# Patient Record
Sex: Male | Born: 1990 | Race: White | Hispanic: No | Marital: Single | Smoking: Never smoker
Health system: Southern US, Community
[De-identification: ages and names within clinical notes are randomized; demographics above are authoritative.]

---
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
## 2017-01-22 HISTORY — PX: KNEE SURGERY: SHX244

## 2017-03-11 DIAGNOSIS — M9901 Segmental and somatic dysfunction of cervical region: Secondary | ICD-10-CM | POA: Diagnosis not present

## 2017-03-11 DIAGNOSIS — M62830 Muscle spasm of back: Secondary | ICD-10-CM | POA: Diagnosis not present

## 2017-03-11 DIAGNOSIS — M9903 Segmental and somatic dysfunction of lumbar region: Secondary | ICD-10-CM | POA: Diagnosis not present

## 2017-03-13 DIAGNOSIS — M9901 Segmental and somatic dysfunction of cervical region: Secondary | ICD-10-CM | POA: Diagnosis not present

## 2017-03-13 DIAGNOSIS — M9903 Segmental and somatic dysfunction of lumbar region: Secondary | ICD-10-CM | POA: Diagnosis not present

## 2017-03-13 DIAGNOSIS — M62830 Muscle spasm of back: Secondary | ICD-10-CM | POA: Diagnosis not present

## 2017-03-14 DIAGNOSIS — M9901 Segmental and somatic dysfunction of cervical region: Secondary | ICD-10-CM | POA: Diagnosis not present

## 2017-03-14 DIAGNOSIS — M62830 Muscle spasm of back: Secondary | ICD-10-CM | POA: Diagnosis not present

## 2017-03-14 DIAGNOSIS — M9903 Segmental and somatic dysfunction of lumbar region: Secondary | ICD-10-CM | POA: Diagnosis not present

## 2017-03-18 DIAGNOSIS — M9903 Segmental and somatic dysfunction of lumbar region: Secondary | ICD-10-CM | POA: Diagnosis not present

## 2017-03-18 DIAGNOSIS — M62830 Muscle spasm of back: Secondary | ICD-10-CM | POA: Diagnosis not present

## 2017-03-18 DIAGNOSIS — M9901 Segmental and somatic dysfunction of cervical region: Secondary | ICD-10-CM | POA: Diagnosis not present

## 2017-03-20 DIAGNOSIS — M9901 Segmental and somatic dysfunction of cervical region: Secondary | ICD-10-CM | POA: Diagnosis not present

## 2017-03-20 DIAGNOSIS — M9903 Segmental and somatic dysfunction of lumbar region: Secondary | ICD-10-CM | POA: Diagnosis not present

## 2017-03-20 DIAGNOSIS — M62830 Muscle spasm of back: Secondary | ICD-10-CM | POA: Diagnosis not present

## 2017-03-21 DIAGNOSIS — M9903 Segmental and somatic dysfunction of lumbar region: Secondary | ICD-10-CM | POA: Diagnosis not present

## 2017-03-21 DIAGNOSIS — M9901 Segmental and somatic dysfunction of cervical region: Secondary | ICD-10-CM | POA: Diagnosis not present

## 2017-03-21 DIAGNOSIS — M62830 Muscle spasm of back: Secondary | ICD-10-CM | POA: Diagnosis not present

## 2017-04-10 DIAGNOSIS — M9903 Segmental and somatic dysfunction of lumbar region: Secondary | ICD-10-CM | POA: Diagnosis not present

## 2017-04-10 DIAGNOSIS — M62830 Muscle spasm of back: Secondary | ICD-10-CM | POA: Diagnosis not present

## 2017-04-10 DIAGNOSIS — M9901 Segmental and somatic dysfunction of cervical region: Secondary | ICD-10-CM | POA: Diagnosis not present

## 2017-04-25 DIAGNOSIS — R509 Fever, unspecified: Secondary | ICD-10-CM | POA: Diagnosis not present

## 2017-09-20 DIAGNOSIS — D2261 Melanocytic nevi of right upper limb, including shoulder: Secondary | ICD-10-CM | POA: Diagnosis not present

## 2017-09-20 DIAGNOSIS — D2262 Melanocytic nevi of left upper limb, including shoulder: Secondary | ICD-10-CM | POA: Diagnosis not present

## 2017-09-20 DIAGNOSIS — B360 Pityriasis versicolor: Secondary | ICD-10-CM | POA: Diagnosis not present

## 2017-09-21 ENCOUNTER — Emergency Department: Payer: Commercial Managed Care - HMO

## 2017-09-21 ENCOUNTER — Other Ambulatory Visit: Payer: Self-pay

## 2017-09-21 ENCOUNTER — Emergency Department
Admission: EM | Admit: 2017-09-21 | Discharge: 2017-09-21 | Disposition: A | Discharge status: Home / Self Care | Payer: Commercial Managed Care - HMO | Attending: Emergency Medicine | Admitting: Emergency Medicine

## 2017-09-21 ENCOUNTER — Encounter: Payer: Self-pay | Admitting: Emergency Medicine

## 2017-09-21 DIAGNOSIS — M62830 Muscle spasm of back: Secondary | ICD-10-CM | POA: Insufficient documentation

## 2017-09-21 DIAGNOSIS — M546 Pain in thoracic spine: Secondary | ICD-10-CM | POA: Diagnosis not present

## 2017-09-21 DIAGNOSIS — M549 Dorsalgia, unspecified: Secondary | ICD-10-CM | POA: Diagnosis not present

## 2017-09-21 MED ORDER — METHOCARBAMOL 500 MG PO TABS: ORAL_TABLET | ORAL | 0 refills | Status: DC

## 2017-09-21 MED ORDER — METHOCARBAMOL 500 MG PO TABS
1000.0000 mg | ORAL_TABLET | Freq: Once | ORAL | Status: AC
Start: 2017-09-21 — End: 2017-09-21
  Administered 2017-09-21: 1000 mg via ORAL
  Filled 2017-09-21: qty 2

## 2017-09-21 MED ORDER — HYDROCODONE-ACETAMINOPHEN 5-325 MG PO TABS: 1.0000 | ORAL_TABLET | Freq: Four times a day (QID) | ORAL | 0 refills | Status: DC | PRN

## 2017-09-21 MED ORDER — IBUPROFEN 600 MG PO TABS
600.0000 mg | ORAL_TABLET | Freq: Once | ORAL | Status: AC
  Administered 2017-09-21: 600 mg via ORAL

## 2017-09-21 MED ORDER — IBUPROFEN 600 MG PO TABS: 600.0000 mg | ORAL_TABLET | Freq: Three times a day (TID) | ORAL | 0 refills | Status: DC | PRN

## 2017-09-21 MED ORDER — HYDROCODONE-ACETAMINOPHEN 5-325 MG PO TABS
1.0000 | ORAL_TABLET | Freq: Once | ORAL | Status: DC
  Filled 2017-09-21: qty 1

## 2017-09-21 MED ORDER — IBUPROFEN 600 MG PO TABS
ORAL_TABLET | ORAL | Status: AC
  Filled 2017-09-21: qty 1

## 2017-09-21 NOTE — ED Provider Notes (Signed)
[facility] Emergency Department Provider Note  ____________________________________________   First MD Initiated Contact with Patient 09/21/17 1233     (approximate)  I have reviewed the triage vital signs and the nursing notes.   HISTORY  Chief Complaint Back Pain   HPI Samuel Hart is a 27 y.o. male resents to the emergency department after he felt muscle spasms in his back when he stepped out of a truck 30 minutes prior to his arrival.  He denies any actual injury to his back or fall like injury.  He continues to have some muscle spasms and is pain is increased with deep inspiration or movement.  Pain is located right posterior back just below the scapula.  Pain is rated as 9/10.   History reviewed. No pertinent past medical history.  There are no active problems to display for this patient.   History reviewed. No pertinent surgical history.  Prior to Admission medications   Medication Sig Start Date End Date Taking? Authorizing Provider  HYDROcodone-acetaminophen (NORCO/VICODIN) 5-325 MG tablet Take 1 tablet by mouth every 6 (six) hours as needed for moderate pain. 09/21/17   Johnn Hai, PA-C  ibuprofen (ADVIL,MOTRIN) 600 MG tablet Take 1 tablet (600 mg total) by mouth every 8 (eight) hours as needed for mild pain. 09/21/17   Johnn Hai, PA-C  methocarbamol (ROBAXIN) 500 MG tablet 1-2 every 6 hours prn muscle spasms 09/21/17   Johnn Hai, PA-C    Allergies Patient has no known allergies.  No family history on file.  Social History Social History   Tobacco Use  . Smoking status: Never Smoker  Substance Use Topics  . Alcohol use: Not on file  . Drug use: Not on file    Review of Systems Constitutional: No fever/chills Eyes: No visual changes. Cardiovascular: Denies chest pain. Respiratory: Denies shortness of breath. Gastrointestinal: No abdominal pain.  No nausea, no vomiting.   Musculoskeletal: Positive for right  upper back pain. Skin: Negative for rash. Neurological: Negative for headaches, focal weakness or numbness. ____________________________________________   PHYSICAL EXAM:  VITAL SIGNS: ED Triage Vitals  Enc Vitals Group     BP 09/21/17 1213 137/81     Pulse Rate 09/21/17 1213 (!) 55     Resp 09/21/17 1213 18     Temp 09/21/17 1213 97.7 F (36.5 C)     Temp Source 09/21/17 1213 Oral     SpO2 09/21/17 1213 100 %     Weight 09/21/17 1216 240 lb (108.9 kg)     Height 09/21/17 1216 6' (1.829 m)     Head Circumference --      Peak Flow --      Pain Score 09/21/17 1215 9     Pain Loc --      Pain Edu? --      Excl. in [place]? --    Constitutional: Alert and oriented. Well appearing and in no acute distress. Eyes: Conjunctivae are normal.  Head: Atraumatic. Neck: No stridor.   Cardiovascular: Normal rate, regular rhythm. Grossly normal heart sounds.  Good peripheral circulation. Respiratory: Normal respiratory effort.  No retractions. Lungs CTAB. Musculoskeletal: On examination of the back there is no gross deformity however there is moderate tenderness on palpation of the parascapular muscles and range of motion is restricted secondary to discomfort.  Patient also with these but inspiration has discomfort in this area.  Area with moderate localized muscle spasms.  No skin discoloration is noted.  On examination of  the shoulder itself there is no gross deformity and no evidence of injury. Neurologic:  Normal speech and language. No gross focal neurologic deficits are appreciated. No gait instability. Skin:  Skin is warm, dry and intact.  No erythema, ecchymosis, or abrasions were seen. Psychiatric: Mood and affect are normal. Speech and behavior are normal.  ____________________________________________   LABS (all labs ordered are listed, but only abnormal results are displayed)  Labs Reviewed - No data to display  RADIOLOGY  ED MD interpretation:   Chest x-ray is  negative.  Official radiology report(s): Dg Chest 2 View  Result Date: 09/21/2017 CLINICAL DATA:  27 year old male with recent onset of back pain/spasm while stepping out of a truck. EXAM: CHEST - 2 VIEW COMPARISON:  None. FINDINGS: The lungs are clear and negative for focal airspace consolidation, pulmonary edema or suspicious pulmonary nodule. No pleural effusion or pneumothorax. Cardiac and mediastinal contours are within normal limits. No acute fracture or lytic or blastic osseous lesions. The visualized upper abdominal bowel gas pattern is unremarkable. IMPRESSION: Normal chest x-ray. Electronically Signed   By: Jacqulynn Cadet M.D.   On: 09/21/2017 12:40    ____________________________________________   PROCEDURES  Procedure(s) performed: None  Procedures  Critical Care performed: No  ____________________________________________   INITIAL IMPRESSION / ASSESSMENT AND PLAN / ED COURSE  As part of my medical decision making, I reviewed the following data within the electronic MEDICAL RECORD NUMBER Notes from prior ED visits and [place] Controlled Substance Database  Patient presents to the emergency department with sudden onset of muscle spasms after getting out of his truck today.  Patient was given methocarbamol 1000 mg p.o. along with ibuprofen.  X-rays were reassuring and patient was made aware that muscle spasms is most likely since he did not actually have an injury.  Patient was given prescription for Robaxin 1 or 2 every 6 hours, ibuprofen 600 mg every 8 hours and Norco every 6 hours if needed for moderate pain.  Patient is encouraged to use moist heat to the area frequently.  We also discussed using a TENS unit starting tomorrow to alleviate some of his discomfort.  Patient is aware that the muscle relaxant could cause drowsiness.  ____________________________________________   FINAL CLINICAL IMPRESSION(S) / ED DIAGNOSES  Final diagnoses:  Muscle spasm of back     ED  Discharge Orders         Ordered    methocarbamol (ROBAXIN) 500 MG tablet     09/21/17 1313    ibuprofen (ADVIL,MOTRIN) 600 MG tablet  Every 8 hours PRN     09/21/17 1313    HYDROcodone-acetaminophen (NORCO/VICODIN) 5-325 MG tablet  Every 6 hours PRN     09/21/17 1313           Note:  This document was prepared using Dragon voice recognition software and may include unintentional dictation errors.    Johnn Hai, PA-C 09/21/17 [place], [place], MD 09/22/17 1329

## 2017-09-21 NOTE — Discharge Instructions (Addendum)
Up with your primary care provider if any continued problems.  Begin taking ibuprofen every 8 hours with food as needed for moderate pain.  Robaxin 1 or 2 every 6 hours as needed for muscle spasms.  This medication could cause drowsiness.  Also a prescription for Norco which contains hydrocodone was sent to your pharmacy.  This medication is 1 every 6 hours as needed for moderate to severe pain.  This medication could also cause drowsiness.  If you feel you do not need this medication the pharmacy can keep it for later use.  10 tablets were dispensed.

## 2017-09-21 NOTE — ED Triage Notes (Signed)
States was stepping out of truck about 30 minutes ago and felt spasm like back pain. Denies fall.

## 2017-11-06 DIAGNOSIS — E669 Obesity, unspecified: Secondary | ICD-10-CM | POA: Diagnosis not present

## 2017-11-06 DIAGNOSIS — S81809A Unspecified open wound, unspecified lower leg, initial encounter: Secondary | ICD-10-CM | POA: Diagnosis not present

## 2017-11-06 DIAGNOSIS — R262 Difficulty in walking, not elsewhere classified: Secondary | ICD-10-CM | POA: Diagnosis not present

## 2017-11-06 DIAGNOSIS — Z6832 Body mass index (BMI) 32.0-32.9, adult: Secondary | ICD-10-CM | POA: Diagnosis not present

## 2017-11-06 DIAGNOSIS — Y33XXXA Other specified events, undetermined intent, initial encounter: Secondary | ICD-10-CM | POA: Diagnosis not present

## 2017-11-06 DIAGNOSIS — W458XXA Other foreign body or object entering through skin, initial encounter: Secondary | ICD-10-CM | POA: Diagnosis not present

## 2017-11-06 DIAGNOSIS — W3400XA Accidental discharge from unspecified firearms or gun, initial encounter: Secondary | ICD-10-CM | POA: Diagnosis not present

## 2017-11-06 DIAGNOSIS — S81831A Puncture wound without foreign body, right lower leg, initial encounter: Secondary | ICD-10-CM | POA: Diagnosis not present

## 2017-11-06 DIAGNOSIS — S299XXA Unspecified injury of thorax, initial encounter: Secondary | ICD-10-CM | POA: Diagnosis not present

## 2017-11-06 DIAGNOSIS — S81031A Puncture wound without foreign body, right knee, initial encounter: Secondary | ICD-10-CM | POA: Diagnosis not present

## 2017-11-06 DIAGNOSIS — G8918 Other acute postprocedural pain: Secondary | ICD-10-CM | POA: Diagnosis not present

## 2017-11-25 DIAGNOSIS — S81031A Puncture wound without foreign body, right knee, initial encounter: Secondary | ICD-10-CM | POA: Diagnosis not present

## 2017-12-02 DIAGNOSIS — M25561 Pain in right knee: Secondary | ICD-10-CM | POA: Diagnosis not present

## 2017-12-02 DIAGNOSIS — R262 Difficulty in walking, not elsewhere classified: Secondary | ICD-10-CM | POA: Diagnosis not present

## 2017-12-06 DIAGNOSIS — M25561 Pain in right knee: Secondary | ICD-10-CM | POA: Diagnosis not present

## 2017-12-06 DIAGNOSIS — R262 Difficulty in walking, not elsewhere classified: Secondary | ICD-10-CM | POA: Diagnosis not present

## 2017-12-10 DIAGNOSIS — M25561 Pain in right knee: Secondary | ICD-10-CM | POA: Diagnosis not present

## 2017-12-10 DIAGNOSIS — R262 Difficulty in walking, not elsewhere classified: Secondary | ICD-10-CM | POA: Diagnosis not present

## 2017-12-11 DIAGNOSIS — R262 Difficulty in walking, not elsewhere classified: Secondary | ICD-10-CM | POA: Diagnosis not present

## 2017-12-11 DIAGNOSIS — M25561 Pain in right knee: Secondary | ICD-10-CM | POA: Diagnosis not present

## 2017-12-16 DIAGNOSIS — M25561 Pain in right knee: Secondary | ICD-10-CM | POA: Diagnosis not present

## 2017-12-16 DIAGNOSIS — R262 Difficulty in walking, not elsewhere classified: Secondary | ICD-10-CM | POA: Diagnosis not present

## 2017-12-18 DIAGNOSIS — M25561 Pain in right knee: Secondary | ICD-10-CM | POA: Diagnosis not present

## 2017-12-18 DIAGNOSIS — R262 Difficulty in walking, not elsewhere classified: Secondary | ICD-10-CM | POA: Diagnosis not present

## 2017-12-23 DIAGNOSIS — S81031D Puncture wound without foreign body, right knee, subsequent encounter: Secondary | ICD-10-CM | POA: Diagnosis not present

## 2017-12-25 DIAGNOSIS — M25561 Pain in right knee: Secondary | ICD-10-CM | POA: Diagnosis not present

## 2017-12-25 DIAGNOSIS — R262 Difficulty in walking, not elsewhere classified: Secondary | ICD-10-CM | POA: Diagnosis not present

## 2017-12-27 DIAGNOSIS — M25561 Pain in right knee: Secondary | ICD-10-CM | POA: Diagnosis not present

## 2017-12-27 DIAGNOSIS — R262 Difficulty in walking, not elsewhere classified: Secondary | ICD-10-CM | POA: Diagnosis not present

## 2018-01-06 DIAGNOSIS — R262 Difficulty in walking, not elsewhere classified: Secondary | ICD-10-CM | POA: Diagnosis not present

## 2018-01-06 DIAGNOSIS — M25561 Pain in right knee: Secondary | ICD-10-CM | POA: Diagnosis not present

## 2018-01-09 DIAGNOSIS — M25561 Pain in right knee: Secondary | ICD-10-CM | POA: Diagnosis not present

## 2018-01-09 DIAGNOSIS — R262 Difficulty in walking, not elsewhere classified: Secondary | ICD-10-CM | POA: Diagnosis not present

## 2018-01-13 DIAGNOSIS — R262 Difficulty in walking, not elsewhere classified: Secondary | ICD-10-CM | POA: Diagnosis not present

## 2018-01-13 DIAGNOSIS — M25561 Pain in right knee: Secondary | ICD-10-CM | POA: Diagnosis not present

## 2018-01-20 DIAGNOSIS — M25561 Pain in right knee: Secondary | ICD-10-CM | POA: Diagnosis not present

## 2018-01-20 DIAGNOSIS — R262 Difficulty in walking, not elsewhere classified: Secondary | ICD-10-CM | POA: Diagnosis not present

## 2018-01-24 DIAGNOSIS — S81031D Puncture wound without foreign body, right knee, subsequent encounter: Secondary | ICD-10-CM | POA: Diagnosis not present

## 2018-01-27 DIAGNOSIS — M25561 Pain in right knee: Secondary | ICD-10-CM | POA: Diagnosis not present

## 2018-01-27 DIAGNOSIS — R262 Difficulty in walking, not elsewhere classified: Secondary | ICD-10-CM | POA: Diagnosis not present

## 2018-02-14 DIAGNOSIS — R262 Difficulty in walking, not elsewhere classified: Secondary | ICD-10-CM | POA: Diagnosis not present

## 2018-02-14 DIAGNOSIS — M25561 Pain in right knee: Secondary | ICD-10-CM | POA: Diagnosis not present

## 2018-03-18 ENCOUNTER — Ambulatory Visit
Admission: EM | Admit: 2018-03-18 | Discharge: 2018-03-18 | Disposition: A | Discharge status: Home / Self Care | Payer: 59 | Attending: Family Medicine | Admitting: Family Medicine

## 2018-03-18 ENCOUNTER — Other Ambulatory Visit: Payer: Self-pay

## 2018-03-18 DIAGNOSIS — S01511A Laceration without foreign body of lip, initial encounter: Secondary | ICD-10-CM

## 2018-03-18 DIAGNOSIS — W228XXA Striking against or struck by other objects, initial encounter: Secondary | ICD-10-CM | POA: Diagnosis not present

## 2018-03-18 NOTE — ED Provider Notes (Signed)
[facility]    CSN: [id number] Arrival date & time: 03/18/18  1200     History   Chief Complaint Chief Complaint  Patient presents with  . Lip Laceration    HPI Samuel Hart is a 28 y.o. male.   HPI  28 year old male accompanied with his father states that he sustained a small laceration to the top lip that occurred when a wrench slipped off of his rifle and hit him in the mouth.  This happened about 45 minutes ago.  States that bled profusely has not time is not bleeding.  Appears to be a very shallow laceration; is oriented above of the vermilion border and does not involve the vermilion border.  It is left of midline.     History reviewed. No pertinent past medical history.  There are no active problems to display for this patient.   Past Surgical History:  Procedure Laterality Date  . KNEE SURGERY Right 2019       Home Medications    Prior to Admission medications   Not on File    Family History History reviewed. No pertinent family history.  Social History Social History   Tobacco Use  . Smoking status: Never Smoker  . Smokeless tobacco: Current User    Types: Snuff  Substance Use Topics  . Alcohol use: Yes    Comment: socially  . Drug use: Not Currently     Allergies   Patient has no known allergies.   Review of Systems Review of Systems  Constitutional: Negative for activity change, appetite change, chills, fatigue and fever.  Skin: Positive for wound.  All other systems reviewed and are negative.    Physical Exam Triage Vital Signs ED Triage Vitals  Enc Vitals Group     BP 03/18/18 1215 126/83     Pulse Rate 03/18/18 1215 (!) 56     Resp 03/18/18 1215 16     Temp 03/18/18 1215 98.4 F (36.9 C)     Temp Source 03/18/18 1215 Oral     SpO2 03/18/18 1215 99 %     Weight 03/18/18 1212 250 lb (113.4 kg)     Height 03/18/18 1212 6' (1.829 m)     Head Circumference --      Peak Flow --      Pain Score 03/18/18 1212 0      Pain Loc --      Pain Edu? --      Excl. in [place]? --    No data found.  Updated Vital Signs BP 126/83 (BP Location: Left Arm)   Pulse (!) 56   Temp 98.4 F (36.9 C) (Oral)   Resp 16   Ht 6' (1.829 m)   Wt 250 lb (113.4 kg)   SpO2 99%   BMI 33.91 kg/m   Visual Acuity Right Eye Distance:   Left Eye Distance:   Bilateral Distance:    Right Eye Near:   Left Eye Near:    Bilateral Near:     Physical Exam Vitals signs and nursing note reviewed.  Constitutional:      General: He is not in acute distress.    Appearance: Normal appearance. He is not ill-appearing, toxic-appearing or diaphoretic.  HENT:     Head: Normocephalic and atraumatic.     Nose: Nose normal.     Mouth/Throat:     Mouth: Mucous membranes are moist.     Pharynx: Oropharynx is clear.     Comments: Exam of  the upper lip shows a longitudinal laceration measuring 6 mm in length and not quite to the subcutaneous tissues.  Not encroach on the vermilion border Eyes:     General:        Right eye: No discharge.        Left eye: No discharge.     Conjunctiva/sclera: Conjunctivae normal.     Pupils: Pupils are equal, round, and reactive to light.  Neurological:     Mental Status: He is alert.      UC Treatments / Results  Labs (all labs ordered are listed, but only abnormal results are displayed) Labs Reviewed - No data to display  EKG None  Radiology No results found.  Procedures Laceration Repair Date/Time: 03/18/2018 1:50 PM Performed by: Lorin Picket, PA-C Authorized by: Coral Spikes, DO   Consent:    Consent obtained:  Verbal   Consent given by:  Patient   Risks discussed:  Infection Anesthesia (see MAR for exact dosages):    Anesthesia method:  None Laceration details:    Location:  Lip   Lip location:  Upper exterior lip   Length (cm):  0.6   Depth (mm):  1 Exploration:    Hemostasis achieved with:  Direct pressure   Contaminated: no   Treatment:    Area cleansed  with:  Saline   Visualized foreign bodies/material removed: no   Skin repair:    Repair method:  Tissue adhesive Approximation:    Approximation:  Close   Vermilion border: well-aligned   Post-procedure details:    Dressing:  Open (no dressing)   Patient tolerance of procedure:  Tolerated well, no immediate complications   (including critical care time)  Medications Ordered in UC Medications - No data to display  Initial Impression / Assessment and Plan / UC Course  I have reviewed the triage vital signs and the nursing notes.  Pertinent labs & imaging results that were available during my care of the patient were reviewed by me and considered in my medical decision making (see chart for details).   Will avoid trying to pick off the glue as it matures.  Heal within 5 to 7 days.   Final Clinical Impressions(s) / UC Diagnoses   Final diagnoses:  Laceration of skin of lip w/o complication, excluding vermilion border, initial encounter   Discharge Instructions   None    ED Prescriptions    None     Controlled Substance Prescriptions [place] Controlled Substance Registry consulted? Not Applicable   Lorin Picket, PA-C 03/18/18 1352

## 2018-03-18 NOTE — ED Triage Notes (Signed)
Patient complains of laceration to top of lip that occurred when a wrench slipped and hit him in the mouth around 45 mins ago.

## 2018-04-07 ENCOUNTER — Other Ambulatory Visit
Admission: RE | Admit: 2018-04-07 | Discharge: 2018-04-07 | Disposition: A | Discharge status: Home / Self Care | Payer: 59 | Source: Other Acute Inpatient Hospital | Attending: Family Medicine | Admitting: Family Medicine

## 2019-01-19 IMAGING — CR DG CHEST 2V
1 series · 2 of 2 positions shown · non-contrast
Comparison: None.

CLINICAL DATA: 27-year-old male with recent onset of back
pain/spasm while stepping out of a truck.

EXAM:
CHEST - 2 VIEW

[Series 1: dg chest 2 view · 0.14mm/px · 2 of 2 slices shown]
[im 1/2]
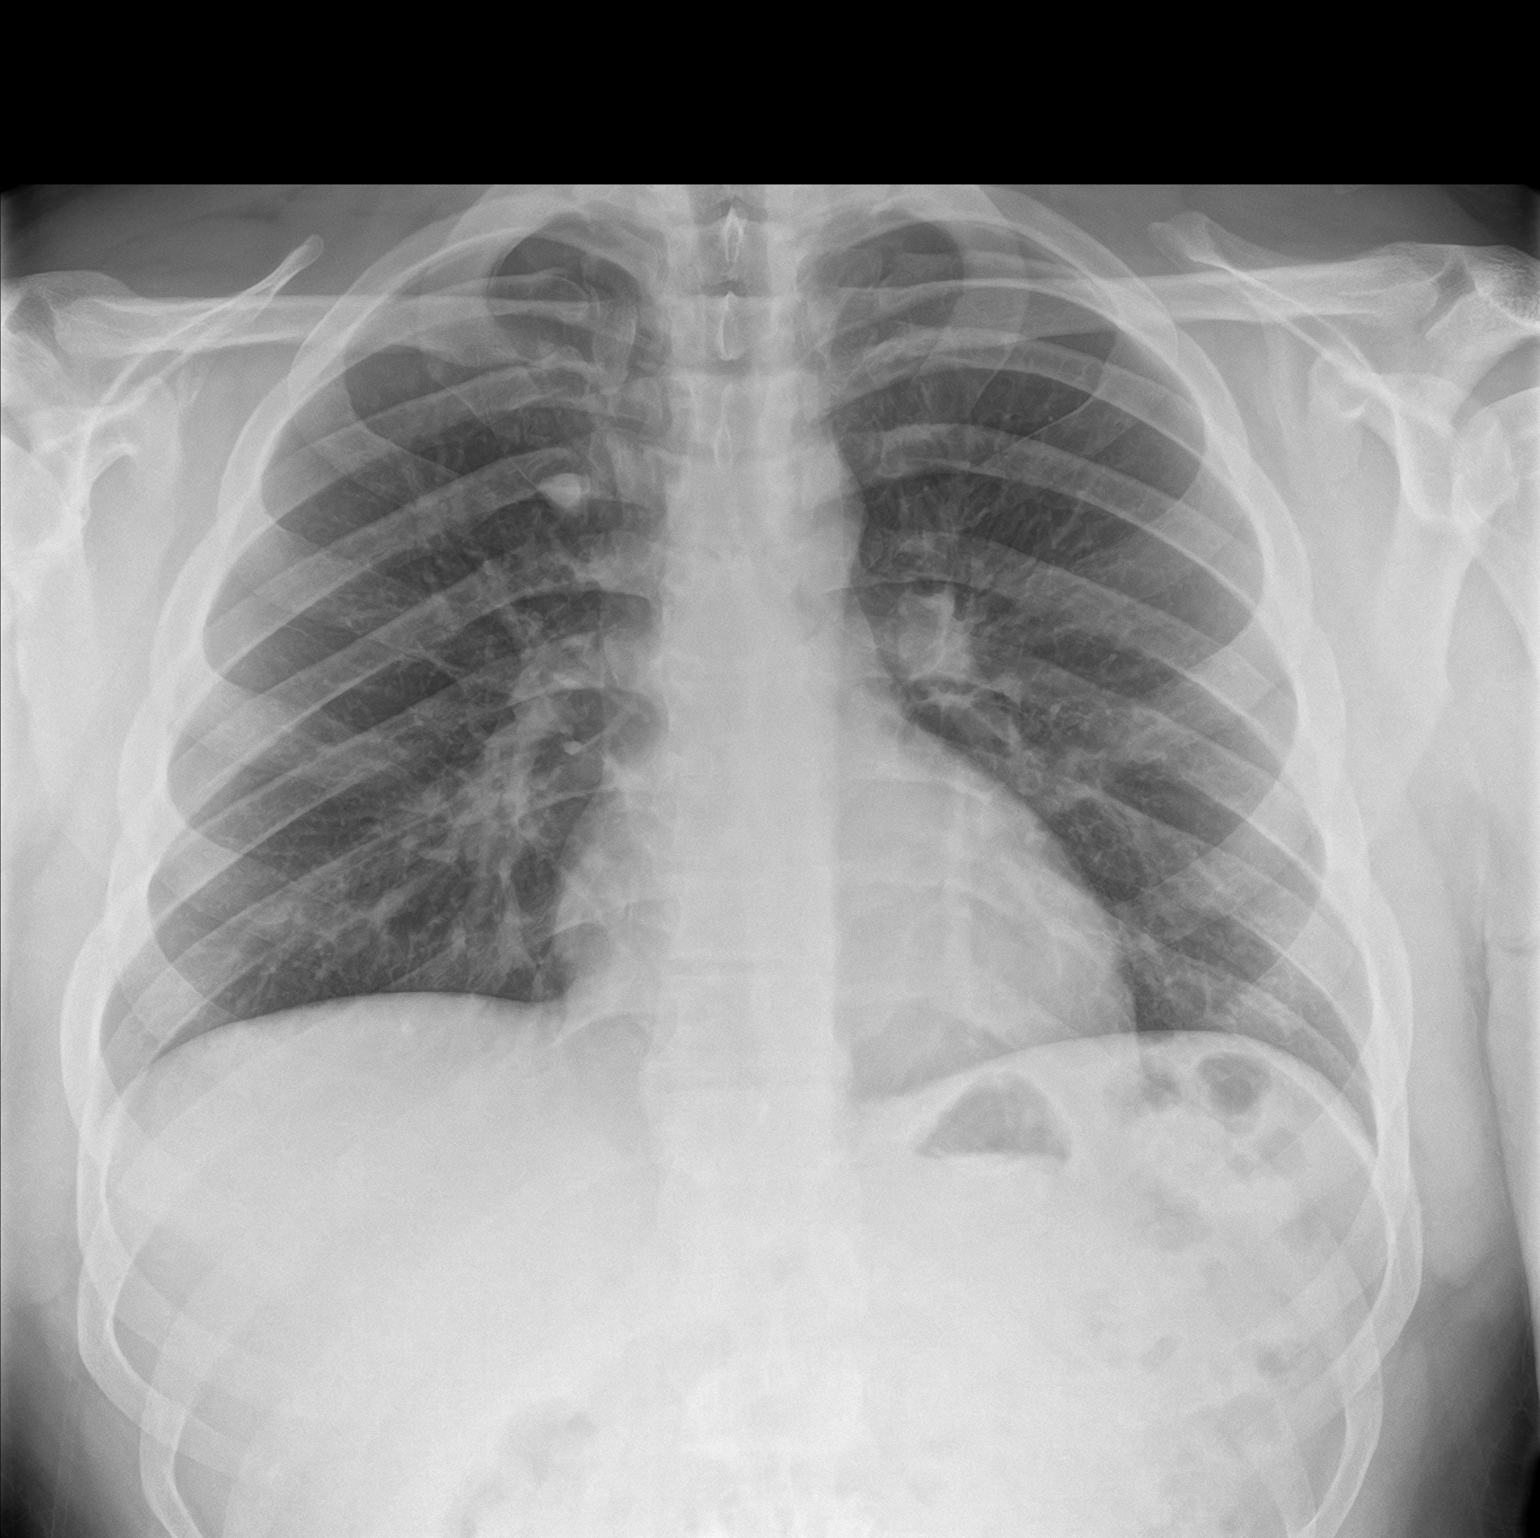
[im 2/2]
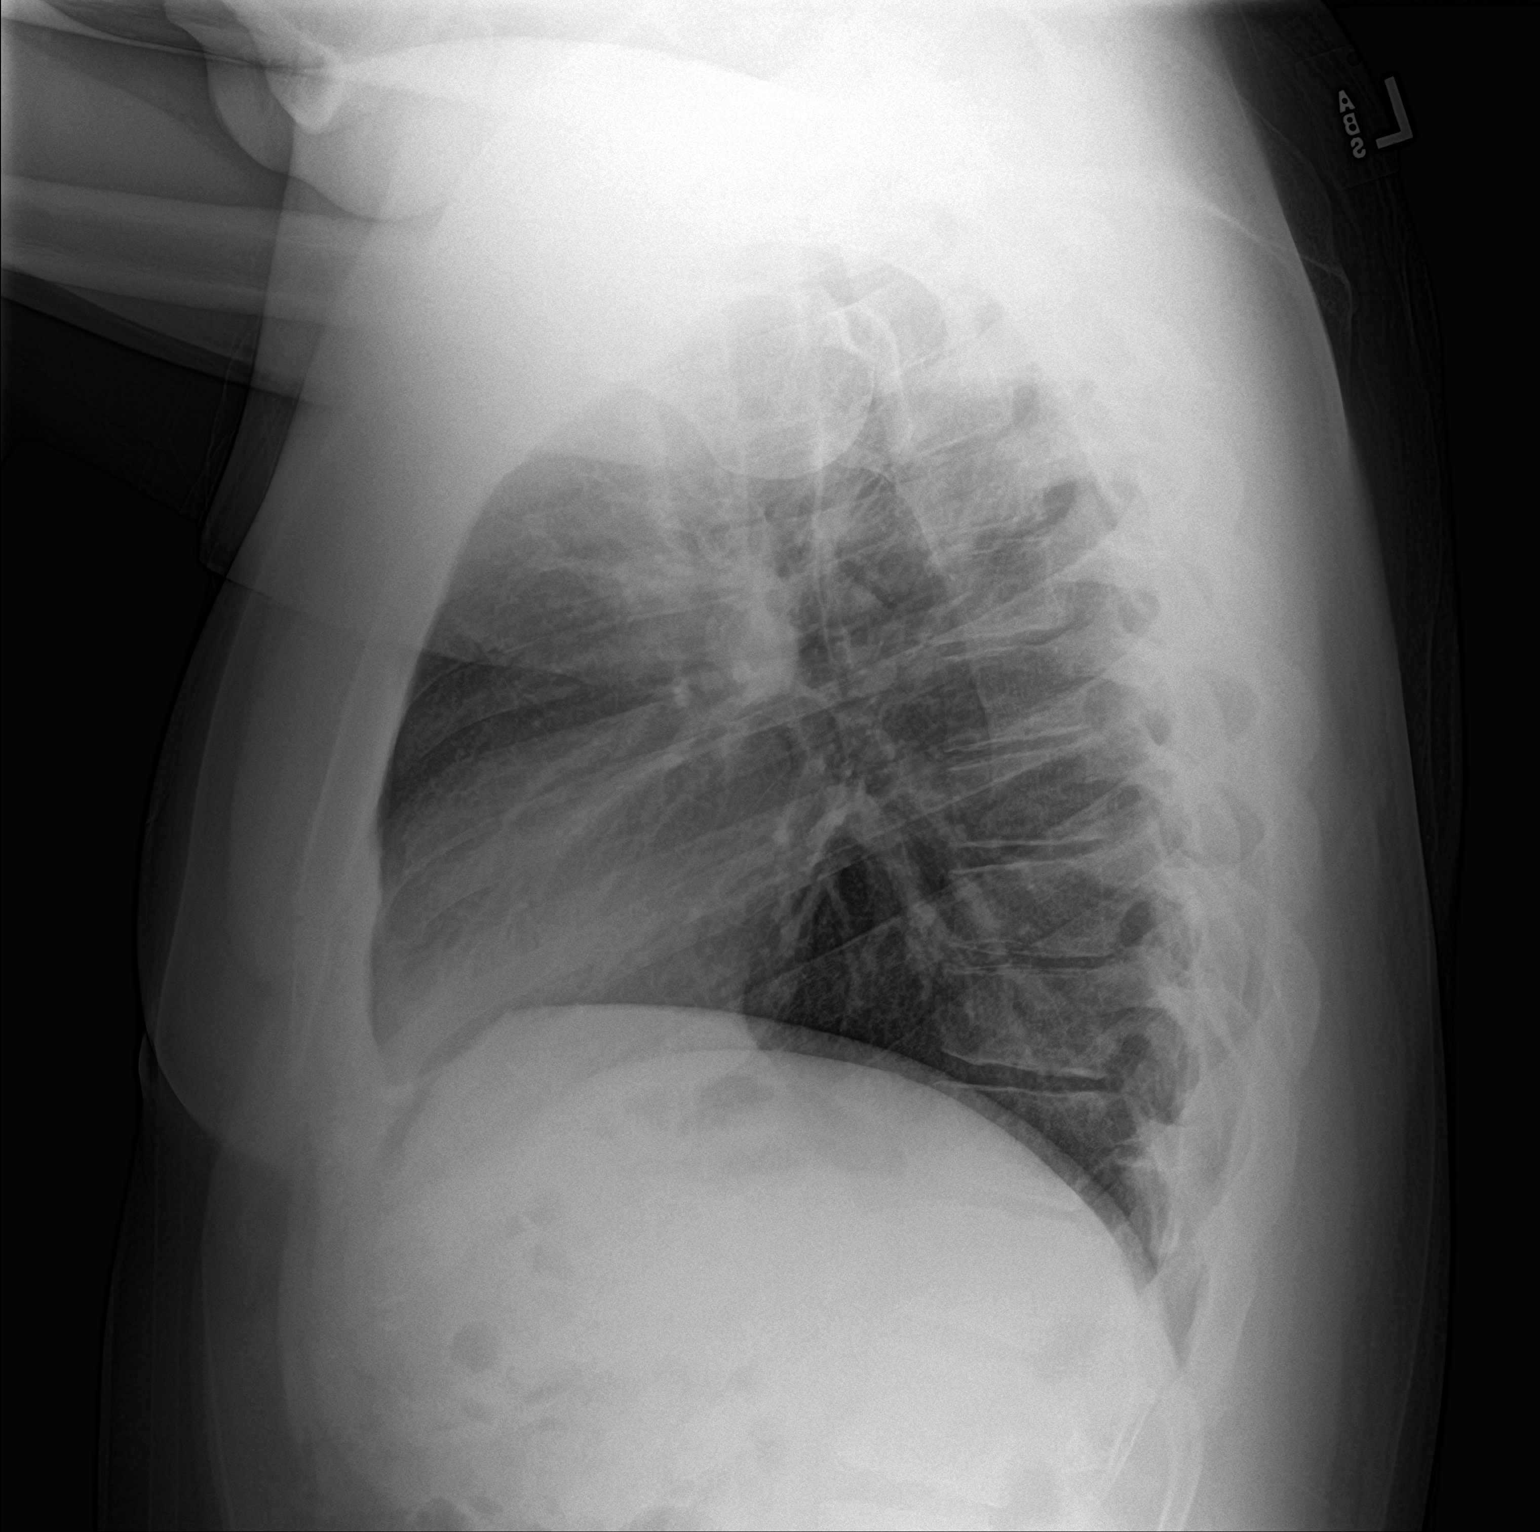

[2 of 2 positions shown; findings below may reference images not displayed]

FINDINGS: The lungs are clear and negative for focal airspace consolidation,
pulmonary edema or suspicious pulmonary nodule. No pleural effusion
or pneumothorax. Cardiac and mediastinal contours are within normal
limits. No acute fracture or lytic or blastic osseous lesions. The
visualized upper abdominal bowel gas pattern is unremarkable.
IMPRESSION: Normal chest x-ray.

## 2019-07-30 ENCOUNTER — Other Ambulatory Visit: Payer: Self-pay

## 2019-07-30 ENCOUNTER — Ambulatory Visit
Admission: EM | Admit: 2019-07-30 | Discharge: 2019-07-30 | Disposition: A | Discharge status: Home / Self Care | Payer: 59 | Attending: Internal Medicine | Admitting: Internal Medicine

## 2019-07-30 DIAGNOSIS — L255 Unspecified contact dermatitis due to plants, except food: Secondary | ICD-10-CM

## 2019-07-30 MED ORDER — HYDROXYZINE HCL 25 MG PO TABS
25.0000 mg | ORAL_TABLET | Freq: Four times a day (QID) | ORAL | 0 refills | Status: AC
Start: 2019-07-30 — End: ?

## 2019-07-30 MED ORDER — PREDNISONE 20 MG PO TABS: 40.0000 mg | ORAL_TABLET | Freq: Every day | ORAL | 0 refills | Status: AC

## 2019-07-30 NOTE — ED Triage Notes (Signed)
Pt with "poison ivy" rash to legs x Sunday. Blistering noted.

## 2019-07-30 NOTE — ED Provider Notes (Signed)
[facility]    CSN: [id number] Arrival date & time: 07/30/19  0802      History   Chief Complaint Chief Complaint  Patient presents with  . Poison Ivy    HPI Samuel Hart is a 29 y.o. male comes to urgent care with worsening pruritic rash which started Sunday.  Patient was exposed to some poison ivy plants.  Rash and blistering and itching started on Sunday.  No redness.  Patient has tried topical steroids and calamine lotion with no improvement.  The rash is around both ankles.  His form some blistering lesions.  No fever or chills.  History reviewed. No pertinent past medical history.  There are no problems to display for this patient.   Past Surgical History:  Procedure Laterality Date  . KNEE SURGERY Right 2019       Home Medications    Prior to Admission medications   Medication Sig Start Date End Date Taking? Authorizing Provider  hydrOXYzine (ATARAX/VISTARIL) 25 MG tablet Take 1 tablet (25 mg total) by mouth every 6 (six) hours. 07/30/19   Engelbert Sevin, Myrene Galas, MD  predniSONE (DELTASONE) 20 MG tablet Take 2 tablets (40 mg total) by mouth daily for 5 days. 07/30/19 08/04/19  Chase Picket, MD    Family History History reviewed. No pertinent family history.  Social History Social History   Tobacco Use  . Smoking status: Never Smoker  . Smokeless tobacco: Current User    Types: Snuff  Vaping Use  . Vaping Use: Never used  Substance Use Topics  . Alcohol use: Yes    Comment: socially  . Drug use: Not Currently     Allergies   Patient has no known allergies.   Review of Systems Review of Systems  HENT: Negative.   Gastrointestinal: Negative.   Skin: Positive for color change and rash. Negative for wound.  Neurological: Negative for dizziness, light-headedness and headaches.     Physical Exam Triage Vital Signs ED Triage Vitals  Enc Vitals Group     BP 07/30/19 0829 137/86     Pulse Rate 07/30/19 0829 (!) 56     Resp 07/30/19 0829  16     Temp 07/30/19 0829 98.2 F (36.8 C)     Temp Source 07/30/19 0829 Oral     SpO2 07/30/19 0829 99 %     Weight 07/30/19 0828 245 lb (111.1 kg)     Height 07/30/19 0828 6' (1.829 m)     Head Circumference --      Peak Flow --      Pain Score 07/30/19 0828 2     Pain Loc --      Pain Edu? --      Excl. in [place]? --    No data found.  Updated Vital Signs BP 137/86 (BP Location: Left Arm)   Pulse (!) 56   Temp 98.2 F (36.8 C) (Oral)   Resp 16   Ht 6' (1.829 m)   Wt 111.1 kg   SpO2 99%   BMI 33.23 kg/m   Visual Acuity Right Eye Distance:   Left Eye Distance:   Bilateral Distance:    Right Eye Near:   Left Eye Near:    Bilateral Near:     Physical Exam Vitals and nursing note reviewed.  Constitutional:      General: He is not in acute distress.    Appearance: Normal appearance. He is not ill-appearing.  Cardiovascular:     Rate and  Rhythm: Normal rate and regular rhythm.     Pulses: Normal pulses.     Heart sounds: Normal heart sounds.  Pulmonary:     Effort: Pulmonary effort is normal.     Breath sounds: Normal breath sounds.  Musculoskeletal:        General: Normal range of motion.  Skin:    General: Skin is warm.     Capillary Refill: Capillary refill takes less than 2 seconds.     Findings: Lesion and rash present. No erythema.  Neurological:     General: No focal deficit present.     Mental Status: He is alert and oriented to person, place, and time.      UC Treatments / Results  Labs (all labs ordered are listed, but only abnormal results are displayed) Labs Reviewed - No data to display  EKG   Radiology No results found.  Procedures Procedures (including critical care time)  Medications Ordered in UC Medications - No data to display  Initial Impression / Assessment and Plan / UC Course  I have reviewed the triage vital signs and the nursing notes.  Pertinent labs & imaging results that were available during my care of the patient  were reviewed by me and considered in my medical decision making (see chart for details).     1.  Rhus dermatitis: Continue applying calamine lotion over the area. Short course of oral steroids Hydroxyzine every 8 hours as needed for itching If symptoms worsen patient is advised to return to urgent care to be reevaluated Patient is advised not to deroof the blisters. 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 Final Clinical Impressions(s) / UC Diagnoses   Final diagnoses:  Rhus dermatitis   Discharge Instructions   None    ED Prescriptions    Medication Sig Dispense Auth. Provider   hydrOXYzine (ATARAX/VISTARIL) 25 MG tablet Take 1 tablet (25 mg total) by mouth every 6 (six) hours. 20 tablet Ellasyn Swilling, Myrene Galas, MD   predniSONE (DELTASONE) 20 MG tablet Take 2 tablets (40 mg total) by mouth daily for 5 days. 10 tablet Kathalene Sporer, Myrene Galas, MD     PDMP not reviewed this encounter.   Chase Picket, MD 07/30/19 [phone]

## 2022-04-04 ENCOUNTER — Emergency Department
Admission: EM | Admit: 2022-04-04 | Discharge: 2022-04-05 | Disposition: A | Discharge status: Home / Self Care | Attending: Emergency Medicine | Admitting: Emergency Medicine

## 2022-04-04 ENCOUNTER — Other Ambulatory Visit: Payer: Self-pay

## 2022-04-04 DIAGNOSIS — T148XXA Other injury of unspecified body region, initial encounter: Secondary | ICD-10-CM | POA: Diagnosis present

## 2022-04-04 DIAGNOSIS — W460XXA Contact with hypodermic needle, initial encounter: Secondary | ICD-10-CM | POA: Insufficient documentation

## 2022-04-04 DIAGNOSIS — Z23 Encounter for immunization: Secondary | ICD-10-CM | POA: Diagnosis not present

## 2022-04-04 MED ORDER — TETANUS-DIPHTH-ACELL PERTUSSIS 5-2.5-18.5 LF-MCG/0.5 IM SUSY
0.5000 mL | PREFILLED_SYRINGE | Freq: Once | INTRAMUSCULAR | Status: AC
  Administered 2022-04-04: 0.5 mL via INTRAMUSCULAR
  Filled 2022-04-04: qty 0.5

## 2022-04-04 NOTE — ED Triage Notes (Signed)
Samuel Hart presents after getting stuck by a needle on his thumb. Pt reports that the owner of the needle admitted to the needle having fentanyl in it. Pt alert and oriented following commands. Breathing unlabored with symmetric chest rise and fall.
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
## 2022-04-04 NOTE — ED Provider Notes (Incomplete)
   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     History reviewed. No pertinent past medical history.  There are no problems to display for this patient.    Physical Exam  Triage Vital Signs: ED Triage Vitals  Enc Vitals Group     BP 04/04/22 2234 (!) 144/91     Pulse Rate 04/04/22 2234 70     Resp 04/04/22 2234 18     Temp 04/04/22 2234 98.2 F (36.8 C)     Temp Source 04/04/22 2234 Oral     SpO2 04/04/22 2234 97 %     Weight 04/04/22 2233 240 lb (108.9 kg)     Height 04/04/22 2233 6' (1.829 m)     Head Circumference --      Peak Flow --      Pain Score 04/04/22 2233 0     Pain Loc --      Pain Edu? --      Excl. in [place]? --     Most recent vital signs: Vitals:   04/04/22 2234  BP: (!) 144/91  Pulse: 70  Resp: 18  Temp: 98.2 F (36.8 C)  SpO2: 97%     General: Awake, no distress. *** CV:  Good peripheral perfusion. *** Resp:  Normal effort. *** Abd:  No distention. *** Neuro:             Awake, Alert, Oriented x 3 *** Other:  ***   ED Results / Procedures / Treatments  Labs (all labs ordered are listed, but only abnormal results are displayed) Labs Reviewed  RAPID HIV SCREEN (HIV 1/2 AB+AG)  COMPREHENSIVE METABOLIC PANEL  HEPATITIS C ANTIBODY  HEPATITIS B SURFACE ANTIGEN     EKG  ***   RADIOLOGY ***   PROCEDURES:  Critical Care performed: {CriticalCareYesNo:19197::"Yes, see critical care procedure note(s)","No"}  Procedures {If the patient is on the monitor, remove the brackets and asterisks. Otherwise delete the sentence below:1} The patient is on the cardiac monitor to evaluate for evidence of arrhythmia and/or significant heart rate changes.   MEDICATIONS  ORDERED IN ED: Medications  Tdap (BOOSTRIX) injection 0.5 mL (has no administration in time range)     IMPRESSION / MDM / ASSESSMENT AND PLAN / ED COURSE  I reviewed the triage vital signs and the nursing notes.                              Patient's presentation is most consistent with {EM COPA:27473}  Differential diagnosis includes, but is not limited to, ***       FINAL CLINICAL IMPRESSION(S) / ED DIAGNOSES   Final diagnoses:  Accident caused by hypodermic needle, initial encounter     Rx / DC Orders   ED Discharge Orders     None        Note:  This document was prepared using Dragon voice recognition software and may include unintentional dictation errors.

## 2022-04-05 LAB — COMPREHENSIVE METABOLIC PANEL
ALT: 42 U/L (ref 0–44)
AST: 28 U/L (ref 15–41)
Albumin: 5 g/dL (ref 3.5–5.0)
Alkaline Phosphatase: 69 U/L (ref 38–126)
Anion gap: 9 (ref 5–15)
BUN: 19 mg/dL (ref 6–20)
CO2: 27 mmol/L (ref 22–32)
Calcium: 10 mg/dL (ref 8.9–10.3)
Chloride: 100 mmol/L (ref 98–111)
Creatinine, Ser: 0.93 mg/dL (ref 0.61–1.24)
GFR, Estimated: >60 mL/min (ref >60)
Glucose, Bld: 90 mg/dL (ref 70–99)
Potassium: 3.9 mmol/L (ref 3.5–5.1)
Sodium: 136 mmol/L (ref 135–145)
Total Bilirubin: 0.9 mg/dL (ref 0.3–1.2)
Total Protein: 8.4 g/dL — ABNORMAL HIGH (ref 6.5–8.1)

## 2022-04-05 LAB — RAPID HIV SCREEN (HIV 1/2 AB+AG)
HIV 1/2 Antibodies: NONREACTIVE
HIV-1 P24 Antigen - HIV24: NONREACTIVE

## 2022-04-05 LAB — HEPATITIS C ANTIBODY: HCV Ab: NONREACTIVE

## 2022-04-05 LAB — HEPATITIS B SURFACE ANTIGEN: Hepatitis B Surface Ag: NONREACTIVE

## 2022-04-05 MED ORDER — BICTEGRAVIR-EMTRICITAB-TENOFOV 50-200-25 MG PO TABS: 1.0000 | ORAL_TABLET | Freq: Every day | ORAL | 0 refills | Status: AC

## 2022-04-05 MED ORDER — BICTEGRAVIR-EMTRICITAB-TENOFOV 50-200-25 MG PO TABS
1.0000 | ORAL_TABLET | Freq: Every day | ORAL | Status: DC
  Administered 2022-04-05: 1 via ORAL
  Filled 2022-04-05 (×2): qty 1

## 2022-04-05 NOTE — Discharge Instructions (Addendum)
Please take the Biktarvy once daily for the next 28 days.  You will need to follow-up for repeat testing at 6 weeks and 31-monthpostexposure for HIV testing.
